# Patient Record
Sex: Female | Born: 1975 | Hispanic: No | Marital: Married | Smoking: Never smoker
Health system: Southern US, Community
[De-identification: ages and names within clinical notes are randomized; demographics above are authoritative.]

---

## 2003-02-09 ENCOUNTER — Inpatient Hospital Stay (HOSPITAL_COMMUNITY): Admission: RE | Admit: 2003-02-09 | Discharge: 2003-02-10 | Payer: Self-pay | Admitting: Obstetrics and Gynecology

## 2010-09-01 ENCOUNTER — Other Ambulatory Visit: Payer: Self-pay

## 2010-09-15 ENCOUNTER — Other Ambulatory Visit: Payer: Self-pay

## 2010-09-15 ENCOUNTER — Ambulatory Visit (HOSPITAL_COMMUNITY)
Admission: RE | Admit: 2010-09-15 | Discharge: 2010-09-15 | Disposition: A | Discharge status: Home / Self Care | Payer: PRIVATE HEALTH INSURANCE | Source: Ambulatory Visit

## 2010-09-15 ENCOUNTER — Ambulatory Visit (HOSPITAL_COMMUNITY)
Admission: RE | Admit: 2010-09-15 | Discharge: 2010-09-15 | Disposition: A | Discharge status: Home / Self Care | Payer: PRIVATE HEALTH INSURANCE | Source: Ambulatory Visit | Attending: Family Medicine | Admitting: Family Medicine

## 2010-09-15 DIAGNOSIS — N6009 Solitary cyst of unspecified breast: Secondary | ICD-10-CM | POA: Insufficient documentation

## 2010-09-15 DIAGNOSIS — N63 Unspecified lump in unspecified breast: Secondary | ICD-10-CM | POA: Insufficient documentation

## 2010-09-21 ENCOUNTER — Other Ambulatory Visit (HOSPITAL_COMMUNITY): Payer: Self-pay | Admitting: Family Medicine

## 2013-02-08 ENCOUNTER — Other Ambulatory Visit (HOSPITAL_COMMUNITY): Payer: Self-pay | Admitting: Nurse Practitioner

## 2013-02-08 DIAGNOSIS — N6310 Unspecified lump in the right breast, unspecified quadrant: Secondary | ICD-10-CM

## 2013-02-20 ENCOUNTER — Ambulatory Visit (HOSPITAL_COMMUNITY)
Admission: RE | Admit: 2013-02-20 | Discharge: 2013-02-20 | Disposition: A | Discharge status: Home / Self Care | Payer: PRIVATE HEALTH INSURANCE | Source: Ambulatory Visit | Attending: Nurse Practitioner | Admitting: Nurse Practitioner

## 2013-02-20 ENCOUNTER — Other Ambulatory Visit (HOSPITAL_COMMUNITY): Payer: Self-pay | Admitting: Nurse Practitioner

## 2013-02-20 ENCOUNTER — Encounter (HOSPITAL_COMMUNITY): Payer: PRIVATE HEALTH INSURANCE

## 2013-02-20 DIAGNOSIS — N6310 Unspecified lump in the right breast, unspecified quadrant: Secondary | ICD-10-CM

## 2013-02-20 DIAGNOSIS — N63 Unspecified lump in unspecified breast: Secondary | ICD-10-CM | POA: Insufficient documentation

## 2013-02-20 DIAGNOSIS — N6009 Solitary cyst of unspecified breast: Secondary | ICD-10-CM | POA: Insufficient documentation

## 2014-08-13 ENCOUNTER — Other Ambulatory Visit (HOSPITAL_COMMUNITY): Payer: Self-pay | Admitting: *Deleted

## 2014-08-13 DIAGNOSIS — N6320 Unspecified lump in the left breast, unspecified quadrant: Secondary | ICD-10-CM

## 2014-08-13 DIAGNOSIS — N6310 Unspecified lump in the right breast, unspecified quadrant: Secondary | ICD-10-CM

## 2014-09-03 ENCOUNTER — Encounter (HOSPITAL_COMMUNITY): Payer: Self-pay | Admitting: *Deleted

## 2014-09-16 ENCOUNTER — Ambulatory Visit (HOSPITAL_COMMUNITY)
Admission: RE | Admit: 2014-09-16 | Discharge: 2014-09-16 | Disposition: A | Discharge status: Home / Self Care | Payer: Self-pay | Source: Ambulatory Visit | Attending: Obstetrics and Gynecology | Admitting: Obstetrics and Gynecology

## 2014-09-16 ENCOUNTER — Encounter (HOSPITAL_COMMUNITY): Payer: Self-pay

## 2014-09-16 VITALS — BP 104/68 | Temp 98.6°F | Ht 62.0 in | Wt 124.4 lb

## 2014-09-16 DIAGNOSIS — N63 Unspecified lump in breast: Secondary | ICD-10-CM | POA: Insufficient documentation

## 2014-09-16 DIAGNOSIS — N6322 Unspecified lump in the left breast, upper inner quadrant: Secondary | ICD-10-CM

## 2014-09-16 DIAGNOSIS — N6310 Unspecified lump in the right breast, unspecified quadrant: Secondary | ICD-10-CM

## 2014-09-16 DIAGNOSIS — N6311 Unspecified lump in the right breast, upper outer quadrant: Secondary | ICD-10-CM

## 2014-09-16 DIAGNOSIS — N6320 Unspecified lump in the left breast, unspecified quadrant: Secondary | ICD-10-CM

## 2014-09-16 DIAGNOSIS — Z1239 Encounter for other screening for malignant neoplasm of breast: Secondary | ICD-10-CM

## 2014-09-16 DIAGNOSIS — N6001 Solitary cyst of right breast: Secondary | ICD-10-CM | POA: Insufficient documentation

## 2014-09-16 NOTE — Progress Notes (Addendum)
Complaints of bilateral breast lumps x 5 months. Patient stated the lump within the right breast is painful at times. Patient rates pain at a 2 out of 10.  Pap Smear:  Pap smear not completed today. [facility] and ASC[facility] HPV-. Per recommendations a Pap smear in 3 years is recommended for follow-up that is due October 2017. Patient has a history of one other abnormal Pap smear 02/04/2010 that was ASCUS HPV-. Pap smear results in EPIC under media.  Physical exam: Breasts Breasts symmetrical. No skin abnormalities bilateral breasts. No nipple retraction bilateral breasts. No nipple discharge bilateral breasts. No lymphadenopathy. Palpated a moveable lump within the right breast at 11 o'clock next to areola. Palpated a lump within the left breast at 11 o'clock next to areola. Complaints of tenderness when palpated lump within the right breast. Referred patient to [facility] for a diagnostic mammogram. Appointment scheduled following [facility] appointment.      Pelvic/Bimanual No Pap smear completed today since last Pap smear was in October 2014. Pap smear not indicated per [facility] guidelines.   Used interpreter Nile RiggsMariel Gallego.

## 2014-09-16 NOTE — Addendum Note (Signed)
Encounter addended by: Priscille Heidelberghristine P Grenda Lora, RN on: 09/16/2014  4:07 PM<BR>     Documentation filed: ED Follow-Up, Patient Instructions Section, Notes Section

## 2014-09-16 NOTE — Patient Instructions (Addendum)
Educational materials on self breast awareness given. Explained to Marissa ShuckMartha S Nichols that she did not need a Pap smear today due to last Pap smear was in October 2014. Let her know [facility] will cover Pap smears every 3 years unless has a history of abnormal Pap smears. Referred patient to [facility] for a diagnostic mammogram. Appointment scheduled following [facility] appointment. Patient aware of appointment and will be there. Let patient know [facility] with results. Marissa ShuckMartha S Nichols verbalized understanding. Patient escorted to [name].  Cheyrl Buley, Kathaleen Maserhristine Poll, RN 11:05 AM

## 2015-01-29 ENCOUNTER — Other Ambulatory Visit (HOSPITAL_COMMUNITY): Payer: Self-pay | Admitting: *Deleted

## 2015-01-29 DIAGNOSIS — N63 Unspecified lump in unspecified breast: Secondary | ICD-10-CM

## 2015-02-10 ENCOUNTER — Encounter (HOSPITAL_COMMUNITY): Payer: Self-pay

## 2015-02-17 ENCOUNTER — Ambulatory Visit (HOSPITAL_COMMUNITY)
Admission: RE | Admit: 2015-02-17 | Discharge: 2015-02-17 | Disposition: A | Discharge status: Home / Self Care | Payer: PRIVATE HEALTH INSURANCE | Source: Ambulatory Visit | Attending: *Deleted | Admitting: *Deleted

## 2015-02-17 DIAGNOSIS — N63 Unspecified lump in unspecified breast: Secondary | ICD-10-CM

## 2015-02-17 DIAGNOSIS — N6009 Solitary cyst of unspecified breast: Secondary | ICD-10-CM | POA: Diagnosis present

## 2015-02-17 DIAGNOSIS — N6002 Solitary cyst of left breast: Secondary | ICD-10-CM | POA: Diagnosis not present

## 2015-02-17 DIAGNOSIS — N6001 Solitary cyst of right breast: Secondary | ICD-10-CM | POA: Diagnosis not present

## 2015-12-15 ENCOUNTER — Other Ambulatory Visit (HOSPITAL_COMMUNITY)
Admission: RE | Admit: 2015-12-15 | Discharge: 2015-12-15 | Disposition: A | Discharge status: Home / Self Care | Payer: PRIVATE HEALTH INSURANCE | Source: Ambulatory Visit | Attending: Unknown Physician Specialty | Admitting: Unknown Physician Specialty

## 2015-12-15 DIAGNOSIS — N879 Dysplasia of cervix uteri, unspecified: Secondary | ICD-10-CM | POA: Insufficient documentation

## 2017-02-28 ENCOUNTER — Other Ambulatory Visit (HOSPITAL_COMMUNITY): Payer: Self-pay | Admitting: *Deleted

## 2017-02-28 DIAGNOSIS — N6310 Unspecified lump in the right breast, unspecified quadrant: Secondary | ICD-10-CM

## 2017-03-14 ENCOUNTER — Ambulatory Visit (HOSPITAL_COMMUNITY)
Admission: RE | Admit: 2017-03-14 | Discharge: 2017-03-14 | Disposition: A | Discharge status: Home / Self Care | Payer: PRIVATE HEALTH INSURANCE | Source: Ambulatory Visit | Attending: *Deleted | Admitting: *Deleted

## 2017-03-14 ENCOUNTER — Other Ambulatory Visit (HOSPITAL_COMMUNITY)
Admission: RE | Admit: 2017-03-14 | Discharge: 2017-03-14 | Disposition: A | Discharge status: Home / Self Care | Payer: PRIVATE HEALTH INSURANCE | Source: Ambulatory Visit | Attending: Nurse Practitioner | Admitting: Nurse Practitioner

## 2017-03-14 DIAGNOSIS — N6310 Unspecified lump in the right breast, unspecified quadrant: Secondary | ICD-10-CM

## 2017-03-14 DIAGNOSIS — N6312 Unspecified lump in the right breast, upper inner quadrant: Secondary | ICD-10-CM | POA: Diagnosis present

## 2017-03-14 DIAGNOSIS — N6001 Solitary cyst of right breast: Secondary | ICD-10-CM | POA: Diagnosis not present

## 2017-03-14 DIAGNOSIS — R87610 Atypical squamous cells of undetermined significance on cytologic smear of cervix (ASC-US): Secondary | ICD-10-CM | POA: Insufficient documentation

## 2017-09-04 ENCOUNTER — Other Ambulatory Visit (HOSPITAL_COMMUNITY): Payer: Self-pay | Admitting: *Deleted

## 2017-09-04 DIAGNOSIS — Z09 Encounter for follow-up examination after completed treatment for conditions other than malignant neoplasm: Secondary | ICD-10-CM

## 2017-09-04 DIAGNOSIS — N6310 Unspecified lump in the right breast, unspecified quadrant: Secondary | ICD-10-CM

## 2017-09-12 ENCOUNTER — Ambulatory Visit (HOSPITAL_COMMUNITY)
Admission: RE | Admit: 2017-09-12 | Discharge: 2017-09-12 | Disposition: A | Discharge status: Home / Self Care | Payer: PRIVATE HEALTH INSURANCE | Source: Ambulatory Visit | Attending: *Deleted | Admitting: *Deleted

## 2017-09-12 DIAGNOSIS — Z09 Encounter for follow-up examination after completed treatment for conditions other than malignant neoplasm: Secondary | ICD-10-CM

## 2017-09-12 DIAGNOSIS — N6310 Unspecified lump in the right breast, unspecified quadrant: Secondary | ICD-10-CM | POA: Diagnosis present

## 2017-10-06 ENCOUNTER — Encounter (HOSPITAL_COMMUNITY): Payer: Self-pay | Admitting: Emergency Medicine

## 2017-10-06 ENCOUNTER — Emergency Department (HOSPITAL_COMMUNITY): Payer: No Typology Code available for payment source

## 2017-10-06 ENCOUNTER — Emergency Department (HOSPITAL_COMMUNITY)
Admission: EM | Admit: 2017-10-06 | Discharge: 2017-10-06 | Disposition: A | Discharge status: Home / Self Care | Payer: No Typology Code available for payment source | Attending: Emergency Medicine | Admitting: Emergency Medicine

## 2017-10-06 DIAGNOSIS — M549 Dorsalgia, unspecified: Secondary | ICD-10-CM | POA: Insufficient documentation

## 2017-10-06 DIAGNOSIS — R109 Unspecified abdominal pain: Secondary | ICD-10-CM | POA: Diagnosis present

## 2017-10-06 DIAGNOSIS — N390 Urinary tract infection, site not specified: Secondary | ICD-10-CM | POA: Diagnosis not present

## 2017-10-06 LAB — URINALYSIS, ROUTINE W REFLEX MICROSCOPIC
Bilirubin Urine: NEGATIVE
Glucose, UA: NEGATIVE mg/dL
Ketones, ur: NEGATIVE mg/dL
Nitrite: NEGATIVE
PROTEIN: NEGATIVE mg/dL
Specific Gravity, Urine: 1.018 (ref 1.005–1.030)
pH: 5 (ref 5.0–8.0)

## 2017-10-06 LAB — POC URINE PREG, ED: Preg Test, Ur: NEGATIVE

## 2017-10-06 MED ORDER — IBUPROFEN 600 MG PO TABS: 600.0000 mg | ORAL_TABLET | Freq: Four times a day (QID) | ORAL | 0 refills | Status: AC

## 2017-10-06 MED ORDER — IBUPROFEN 400 MG PO TABS
400.0000 mg | ORAL_TABLET | Freq: Once | ORAL | Status: AC
  Administered 2017-10-06: 400 mg via ORAL
  Filled 2017-10-06: qty 1

## 2017-10-06 MED ORDER — CEPHALEXIN 500 MG PO CAPS: 500.0000 mg | ORAL_CAPSULE | Freq: Four times a day (QID) | ORAL | 0 refills | Status: AC

## 2017-10-06 MED ORDER — CEPHALEXIN 500 MG PO CAPS
500.0000 mg | ORAL_CAPSULE | Freq: Once | ORAL | Status: AC
  Administered 2017-10-06: 500 mg via ORAL
  Filled 2017-10-06: qty 1

## 2017-10-06 MED ORDER — ONDANSETRON HCL 4 MG PO TABS
4.0000 mg | ORAL_TABLET | Freq: Once | ORAL | Status: AC
  Administered 2017-10-06: 4 mg via ORAL
  Filled 2017-10-06: qty 1

## 2017-10-06 MED ORDER — ACETAMINOPHEN 500 MG PO TABS
1000.0000 mg | ORAL_TABLET | Freq: Once | ORAL | Status: AC
  Administered 2017-10-06: 1000 mg via ORAL
  Filled 2017-10-06: qty 2

## 2017-10-06 NOTE — Discharge Instructions (Addendum)
Your vital signs are within normal limits.  Your urine test suggest a urinary tract infection.  Please use Keflex with breakfast, lunch, dinner, and at bedtime until all are taken.  Please increase your fluids, particularly water.  The x-ray of your chest shows no broken bones, and nothing dislocated.  You can expect to be sore over the next few days.  Please use 600 mg of ibuprofen with breakfast, lunch, dinner, and at bedtime.  You may use Tylenol extra strength in between the doses if needed.  Please return to the emergency department if any changes in your condition, problems, or concerns.
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
## 2017-10-08 LAB — URINE CULTURE: SPECIAL REQUESTS: NORMAL

## 2018-03-20 ENCOUNTER — Other Ambulatory Visit (HOSPITAL_COMMUNITY): Payer: Self-pay | Admitting: *Deleted

## 2018-03-20 DIAGNOSIS — IMO0002 Reserved for concepts with insufficient information to code with codable children: Secondary | ICD-10-CM

## 2018-03-20 DIAGNOSIS — R229 Localized swelling, mass and lump, unspecified: Principal | ICD-10-CM

## 2018-03-27 ENCOUNTER — Other Ambulatory Visit (HOSPITAL_COMMUNITY): Payer: Self-pay | Admitting: *Deleted

## 2018-03-27 DIAGNOSIS — R229 Localized swelling, mass and lump, unspecified: Principal | ICD-10-CM

## 2018-03-27 DIAGNOSIS — IMO0002 Reserved for concepts with insufficient information to code with codable children: Secondary | ICD-10-CM

## 2018-04-03 ENCOUNTER — Ambulatory Visit (HOSPITAL_COMMUNITY): Payer: PRIVATE HEALTH INSURANCE

## 2018-04-03 ENCOUNTER — Ambulatory Visit (HOSPITAL_COMMUNITY)
Admission: RE | Admit: 2018-04-03 | Discharge: 2018-04-03 | Disposition: A | Discharge status: Home / Self Care | Payer: PRIVATE HEALTH INSURANCE | Source: Ambulatory Visit | Attending: *Deleted | Admitting: *Deleted

## 2018-04-03 DIAGNOSIS — R229 Localized swelling, mass and lump, unspecified: Principal | ICD-10-CM

## 2018-04-03 DIAGNOSIS — IMO0002 Reserved for concepts with insufficient information to code with codable children: Secondary | ICD-10-CM

## 2018-08-09 ENCOUNTER — Other Ambulatory Visit (HOSPITAL_COMMUNITY): Payer: Self-pay | Admitting: *Deleted

## 2018-08-09 DIAGNOSIS — N6310 Unspecified lump in the right breast, unspecified quadrant: Secondary | ICD-10-CM

## 2018-08-14 ENCOUNTER — Other Ambulatory Visit: Payer: Self-pay

## 2018-08-14 ENCOUNTER — Ambulatory Visit (HOSPITAL_COMMUNITY): Admission: RE | Admit: 2018-08-14 | Payer: PRIVATE HEALTH INSURANCE | Source: Ambulatory Visit

## 2018-08-14 ENCOUNTER — Ambulatory Visit (HOSPITAL_COMMUNITY)
Admission: RE | Admit: 2018-08-14 | Discharge: 2018-08-14 | Disposition: A | Discharge status: Home / Self Care | Payer: PRIVATE HEALTH INSURANCE | Source: Ambulatory Visit | Attending: *Deleted | Admitting: *Deleted

## 2018-08-14 DIAGNOSIS — N6310 Unspecified lump in the right breast, unspecified quadrant: Secondary | ICD-10-CM | POA: Insufficient documentation
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
## 2019-04-02 ENCOUNTER — Other Ambulatory Visit (HOSPITAL_COMMUNITY): Payer: Self-pay | Admitting: *Deleted

## 2019-04-02 DIAGNOSIS — R928 Other abnormal and inconclusive findings on diagnostic imaging of breast: Secondary | ICD-10-CM

## 2019-04-09 ENCOUNTER — Other Ambulatory Visit (HOSPITAL_COMMUNITY): Payer: Self-pay | Admitting: *Deleted

## 2019-04-09 DIAGNOSIS — R928 Other abnormal and inconclusive findings on diagnostic imaging of breast: Secondary | ICD-10-CM

## 2019-04-16 ENCOUNTER — Ambulatory Visit (HOSPITAL_COMMUNITY)
Admission: RE | Admit: 2019-04-16 | Discharge: 2019-04-16 | Disposition: A | Discharge status: Home / Self Care | Payer: PRIVATE HEALTH INSURANCE | Source: Ambulatory Visit | Attending: *Deleted | Admitting: *Deleted

## 2019-04-16 ENCOUNTER — Other Ambulatory Visit: Payer: Self-pay

## 2019-04-16 DIAGNOSIS — R928 Other abnormal and inconclusive findings on diagnostic imaging of breast: Secondary | ICD-10-CM

## 2020-06-26 IMAGING — MG DIGITAL DIAGNOSTIC BILAT W/ TOMO W/ CAD
8 series · 8 of 24 positions shown · non-contrast
Comparison: Previous exam(s).

CLINICAL DATA: Follow-up for probably benign right breast mass.

EXAM:
DIGITAL DIAGNOSTIC BILATERAL MAMMOGRAM WITH CAD AND TOMO

[L CC synth-2D]
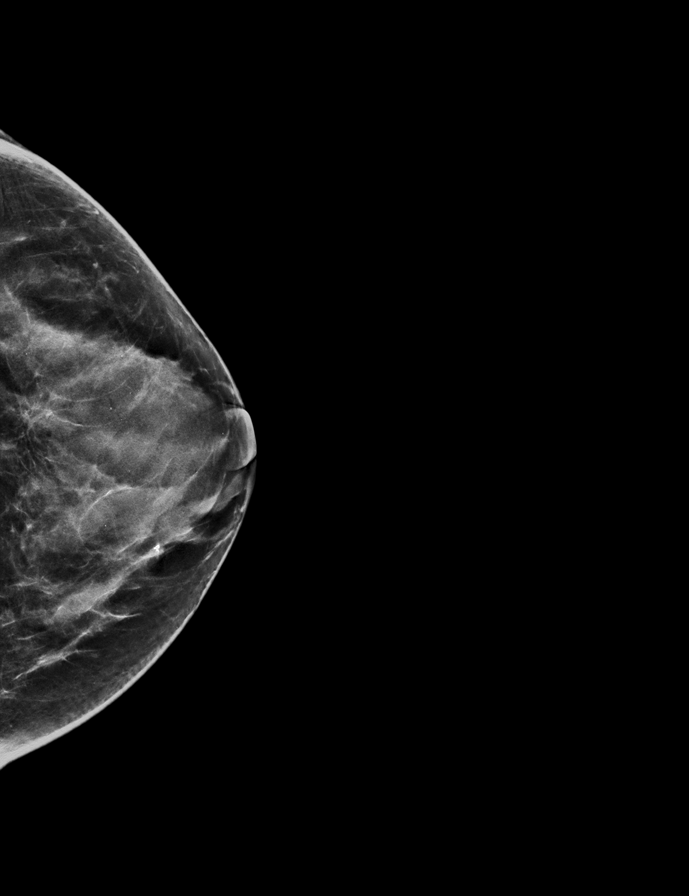

[R CC synth-2D]
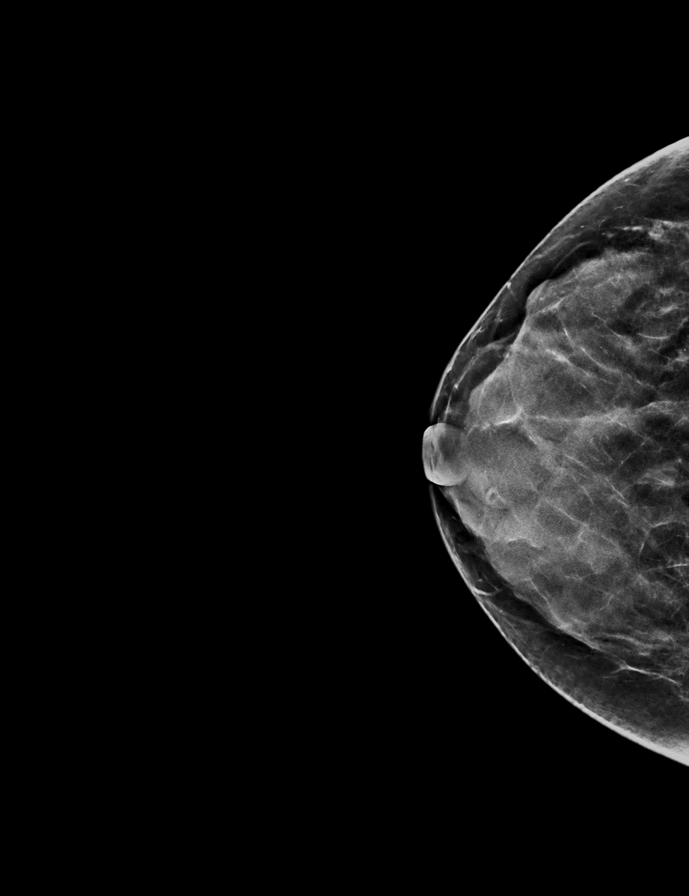

[L MLO synth-2D]
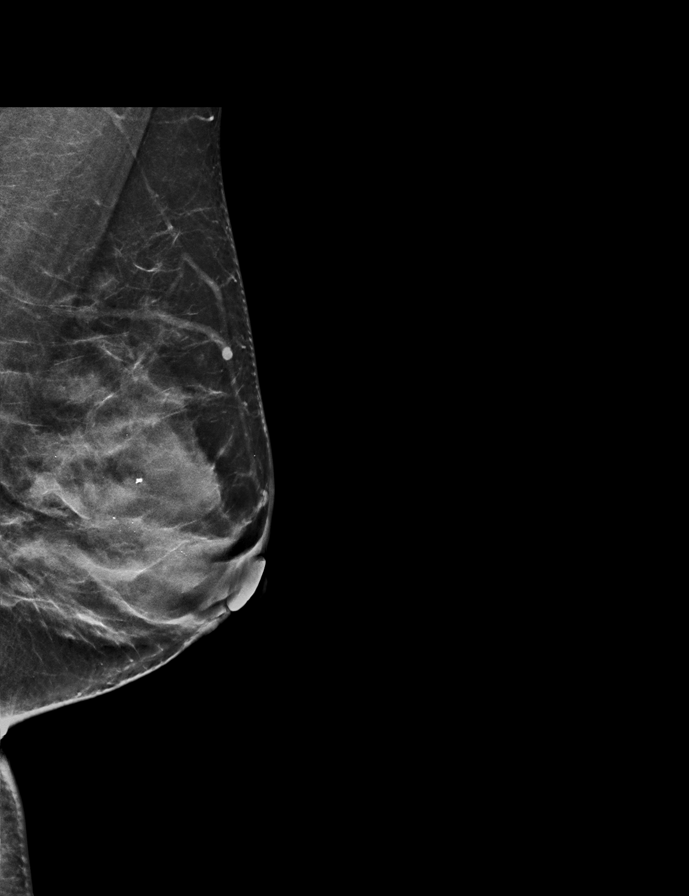

[R MLO synth-2D]
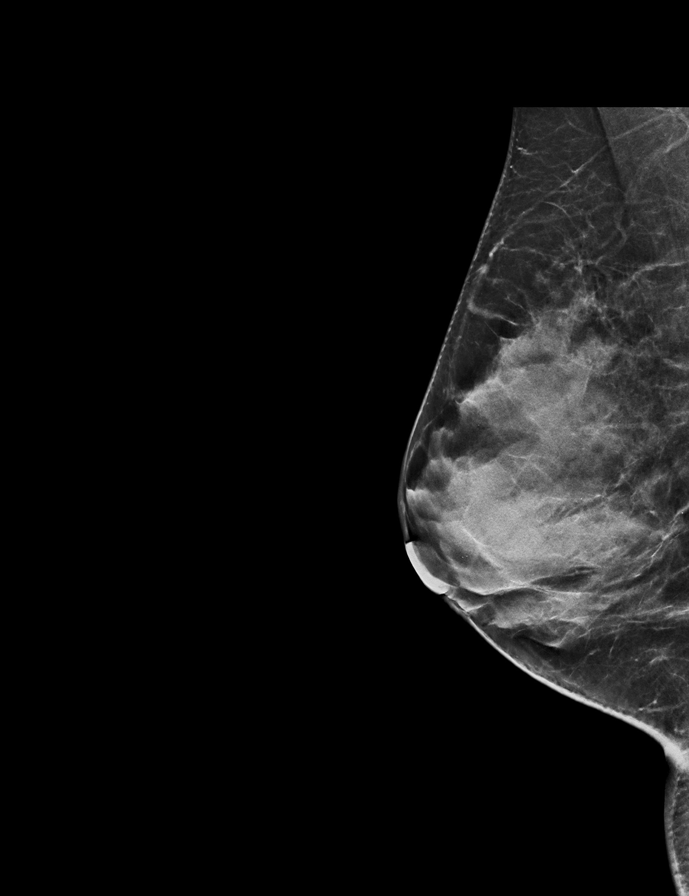

[L CC tomo · tomo slice 27/52.0]
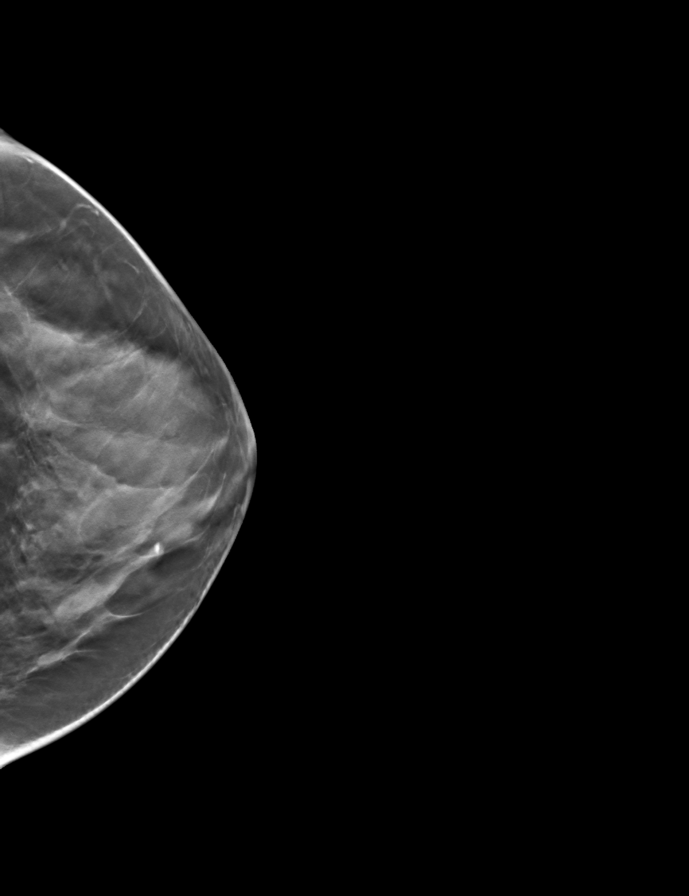

[R CC tomo · tomo slice 25/48.0]
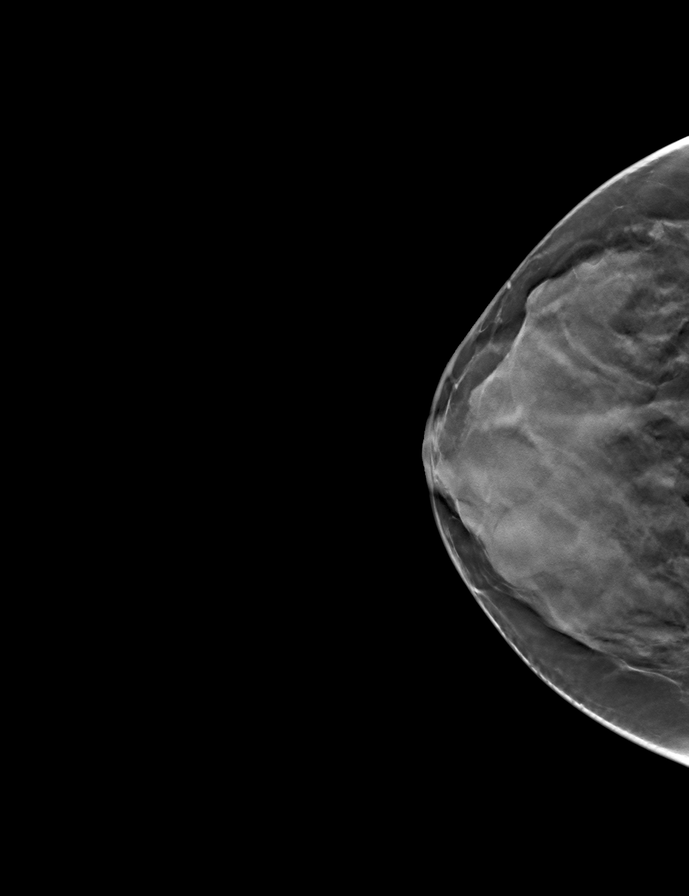

[L MLO tomo · tomo slice 29/56.0]
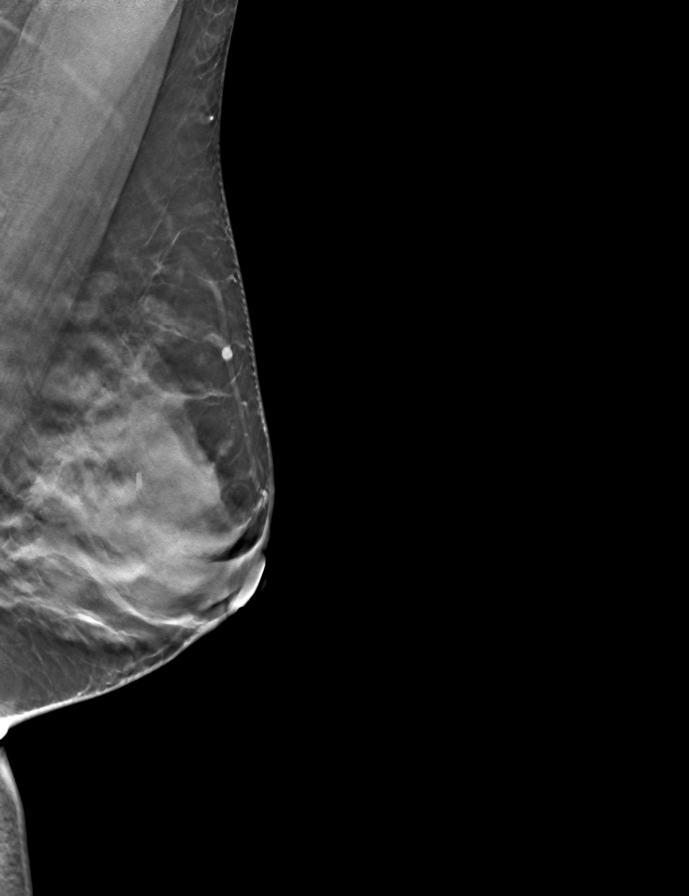

[R MLO tomo · tomo slice 27/53.0]
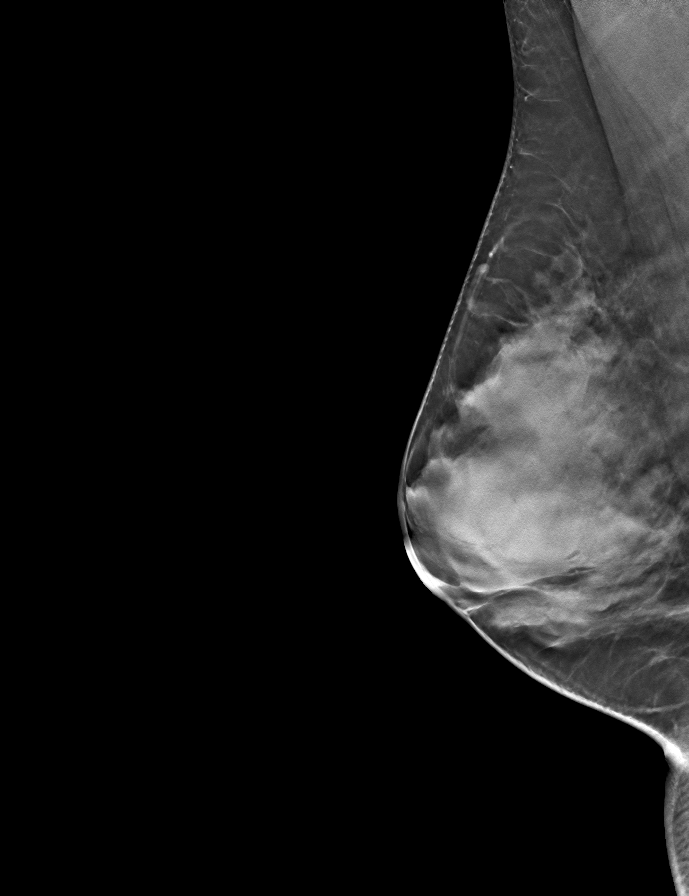

[8 of 24 positions shown; findings below may reference images not displayed]

ACR Breast Density Category d: The breast tissue is extremely dense,
which lowers the sensitivity of mammography.
FINDINGS: No suspicious masses or calcifications are seen in either breast.
There is no mammographic evidence of malignancy in either breast.

Mammographic images were processed with CAD.

Targeted ultrasound of the right breast was performed demonstrating
numerous cysts. A large cyst at 12 o'clock 1 cm from the nipple
measures 2.2 x 1.7 x 1.6 cm and an additional cyst at the [DATE]
position 1 cm from the nipple measures 3.1 x 1.4 x 3.1 cm. The oval
circumscribed hypoechoic gently lobulated mass in the right breast
at 1 o'clock 2 cm from nipple measures 1.8 x 1.2 x 1.9 cm. This is
unchanged in size and appearance given differences in imaging and
measurement technique and considered benign given 2 years of
stability, minimal growth.
IMPRESSION: No findings of malignancy in either breast.

RECOMMENDATION:
Screening mammogram in one year.(Code:GH-D-T7R)

I have discussed the findings and recommendations with the patient.
If applicable, a reminder letter will be sent to the patient
regarding the next appointment.

BI-RADS CATEGORY  2: Benign.

## 2022-05-04 ENCOUNTER — Other Ambulatory Visit: Payer: Self-pay | Admitting: *Deleted

## 2022-05-04 DIAGNOSIS — Z1231 Encounter for screening mammogram for malignant neoplasm of breast: Secondary | ICD-10-CM

## 2022-05-30 ENCOUNTER — Ambulatory Visit (HOSPITAL_COMMUNITY)
Admission: RE | Admit: 2022-05-30 | Discharge: 2022-05-30 | Disposition: A | Discharge status: Home / Self Care | Payer: PRIVATE HEALTH INSURANCE | Source: Ambulatory Visit | Attending: *Deleted | Admitting: *Deleted

## 2022-05-30 DIAGNOSIS — Z1231 Encounter for screening mammogram for malignant neoplasm of breast: Secondary | ICD-10-CM | POA: Insufficient documentation
